# Patient Record
Sex: Male | Born: 1970 | Race: White | Hispanic: No | Marital: Married | Smoking: Never smoker
Health system: Southern US, Community
[De-identification: ages and names within clinical notes are randomized; demographics above are authoritative.]

## PROBLEM LIST (undated history)

## (undated) DIAGNOSIS — I10 Essential (primary) hypertension: Secondary | ICD-10-CM

---

## 2018-12-09 ENCOUNTER — Emergency Department (HOSPITAL_COMMUNITY)

## 2018-12-09 ENCOUNTER — Emergency Department (HOSPITAL_COMMUNITY)
Admission: EM | Admit: 2018-12-09 | Discharge: 2018-12-09 | Disposition: A | Discharge status: Home / Self Care | Attending: Emergency Medicine | Admitting: Emergency Medicine

## 2018-12-09 ENCOUNTER — Other Ambulatory Visit: Payer: Self-pay

## 2018-12-09 ENCOUNTER — Encounter (HOSPITAL_COMMUNITY): Payer: Self-pay | Admitting: Obstetrics and Gynecology

## 2018-12-09 DIAGNOSIS — W228XXA Striking against or struck by other objects, initial encounter: Secondary | ICD-10-CM | POA: Insufficient documentation

## 2018-12-09 DIAGNOSIS — I10 Essential (primary) hypertension: Secondary | ICD-10-CM | POA: Insufficient documentation

## 2018-12-09 DIAGNOSIS — S82424A Nondisplaced transverse fracture of shaft of right fibula, initial encounter for closed fracture: Secondary | ICD-10-CM | POA: Diagnosis not present

## 2018-12-09 DIAGNOSIS — Y9389 Activity, other specified: Secondary | ICD-10-CM | POA: Diagnosis not present

## 2018-12-09 DIAGNOSIS — Y9289 Other specified places as the place of occurrence of the external cause: Secondary | ICD-10-CM | POA: Diagnosis not present

## 2018-12-09 DIAGNOSIS — S8991XA Unspecified injury of right lower leg, initial encounter: Secondary | ICD-10-CM | POA: Diagnosis present

## 2018-12-09 DIAGNOSIS — Y990 Civilian activity done for income or pay: Secondary | ICD-10-CM | POA: Insufficient documentation

## 2018-12-09 HISTORY — DX: Essential (primary) hypertension: I10

## 2018-12-09 NOTE — ED Triage Notes (Signed)
Pt reports he was hurt at work on his right leg. Pt reports he cannot put weight on the leg and was hit by a heavy object. Pt has some discoloration to the area

## 2018-12-09 NOTE — ED Provider Notes (Signed)
[facility]-EMERGENCY DEPT Provider Note   CSN: [id number] Arrival date & time: 12/09/18  2047     History   Chief Complaint Chief Complaint  Patient presents with  . Leg Pain    HPI David Walters is a 48 y.o. male.  HPI  48 year old male presents with right lateral lower leg injury.  He was driving a cart at work at about 5 miles an hour and his right leg hit an object.  He has had immediate pain and swelling since.  He is unable to bear weight due to the pain.  At rest there is essentially no pain but any type of movement or trying to put weight on it makes the pain worse.  No weakness or numbness.  Past Medical History:  Diagnosis Date  . Hypertension     There are no active problems to display for this patient.   History reviewed. No pertinent surgical history.      Home Medications    Prior to Admission medications   Not on File    Family History No family history on file.  Social History Social History   Tobacco Use  . Smoking status: Never Smoker  Substance Use Topics  . Alcohol use: Not Currently  . Drug use: Not Currently     Allergies   Sulfa antibiotics   Review of Systems Review of Systems  Musculoskeletal: Positive for arthralgias and joint swelling.  Neurological: Negative for weakness and numbness.     Physical Exam Updated Vital Signs BP (!) 140/92   Pulse 93   Temp 98.3 F (36.8 C) (Oral)   Resp 16   Ht 5\' 10"  (1.778 m)   Wt 86.2 kg   SpO2 100%   BMI 27.26 kg/m   Physical Exam Vitals signs and nursing note reviewed.  Constitutional:      Appearance: He is well-developed.  HENT:     Head: Normocephalic and atraumatic.     Right Ear: External ear normal.     Left Ear: External ear normal.     Nose: Nose normal.  Eyes:     General:        Right eye: No discharge.        Left eye: No discharge.  Neck:     Musculoskeletal: Neck supple.  Cardiovascular:     Rate and Rhythm: Normal rate and  regular rhythm.     Pulses:          Dorsalis pedis pulses are 2+ on the right side.  Pulmonary:     Effort: Pulmonary effort is normal.  Abdominal:     General: There is no distension.  Musculoskeletal:     Right knee: No tenderness found.     Right ankle: He exhibits normal range of motion. No tenderness.     Right lower leg: He exhibits tenderness, bony tenderness and swelling.       Legs:     Right foot: Normal range of motion. No tenderness.     Comments: Normal strength testing, sensation, and movement in the right lower leg  Skin:    General: Skin is warm and dry.  Neurological:     Mental Status: He is alert.  Psychiatric:        Mood and Affect: Mood is not anxious.      ED Treatments / Results  Labs (all labs ordered are listed, but only abnormal results are displayed) Labs Reviewed - No data to display  EKG  None  Radiology Dg Tibia/fibula Right  Result Date: 12/09/2018 CLINICAL DATA:  Patient heard right lower leg at work and [employer]. Was hit by a heavy object. EXAM: RIGHT TIBIA AND FIBULA - 2 VIEW COMPARISON:  None. FINDINGS: There is an acute, closed, predominantly transverse nondisplaced fracture of the fibular diaphysis at the junction of the proximal and middle third. The tibia appears intact. No joint dislocation or effusions. Soft tissues are largely unremarkable. No radiopaque foreign body or soft tissue emphysema. IMPRESSION: Acute, closed, predominantly transverse nondisplaced fracture of the fibular diaphysis at the junction of the proximal and middle third. Electronically Signed   By: Tollie Eth M.D.   On: 12/09/2018 21:31    Procedures Procedures (including critical care time)  Medications Ordered in ED Medications - No data to display   Initial Impression / Assessment and Plan / ED Course  I have reviewed the triage vital signs and the nursing notes.  Pertinent labs & imaging results that were available during my care of the patient  were reviewed by me and considered in my medical decision making (see chart for details).     Patient is neurovascular intact.  No evidence of peroneal injury or compartment syndrome.  Discussed with Dr. Roda Shutters, will place in cam walker with crutches for weightbearing as tolerated.  He declines anything stronger than ibuprofen at home and does not want any medicine currently.  He appears stable for discharge to follow-up with Ortho in 1-2 weeks.  Final Clinical Impressions(s) / ED Diagnoses   Final diagnoses:  Closed nondisplaced transverse fracture of shaft of right fibula, initial encounter    ED Discharge Orders    None       Pricilla Loveless, MD 12/09/18 2336

## 2018-12-09 NOTE — Discharge Instructions (Signed)
You may bear weight as tolerated.  When you are at rest, elevate your leg and you can apply ice.  Make sure you are always wearing the boot prior to putting your foot down.  Call the orthopedist tomorrow for an appointment in 1-2 weeks.  If you develop severe or intractable pain, severe swelling, weakness or numbness in your foot, or any other new/concerning symptoms then return to the ER for evaluation.

## 2019-09-26 IMAGING — CR DG TIBIA/FIBULA 2V*R*
4 series · 4 of 4 positions shown · non-contrast
Comparison: None.

CLINICAL DATA: Patient heard right lower leg at work and canal
weightbear. Was hit by a heavy object.

EXAM:
RIGHT TIBIA AND FIBULA - 2 VIEW

[x tib-fib ap right (1 of 2)]
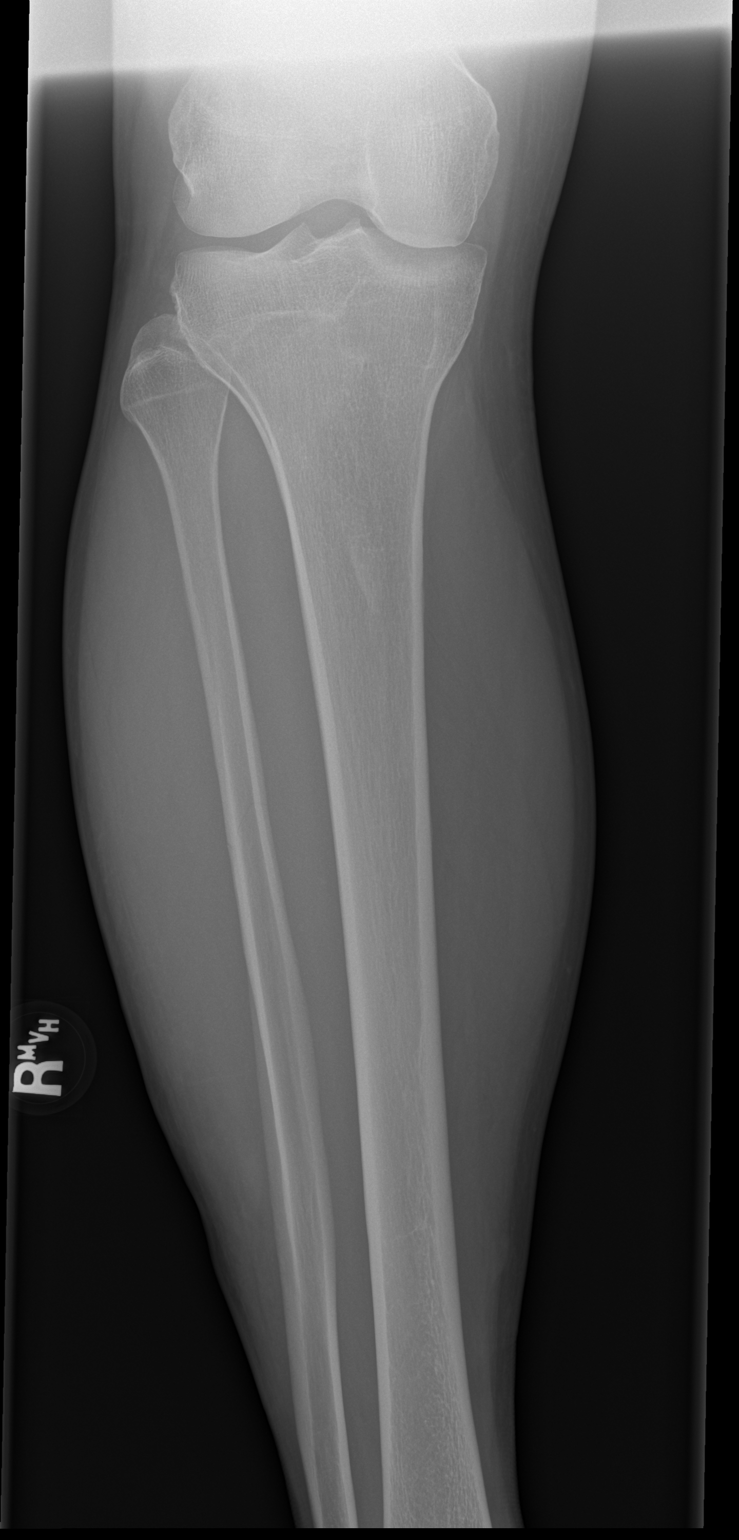

[x tib-fib ap right (2 of 2)]
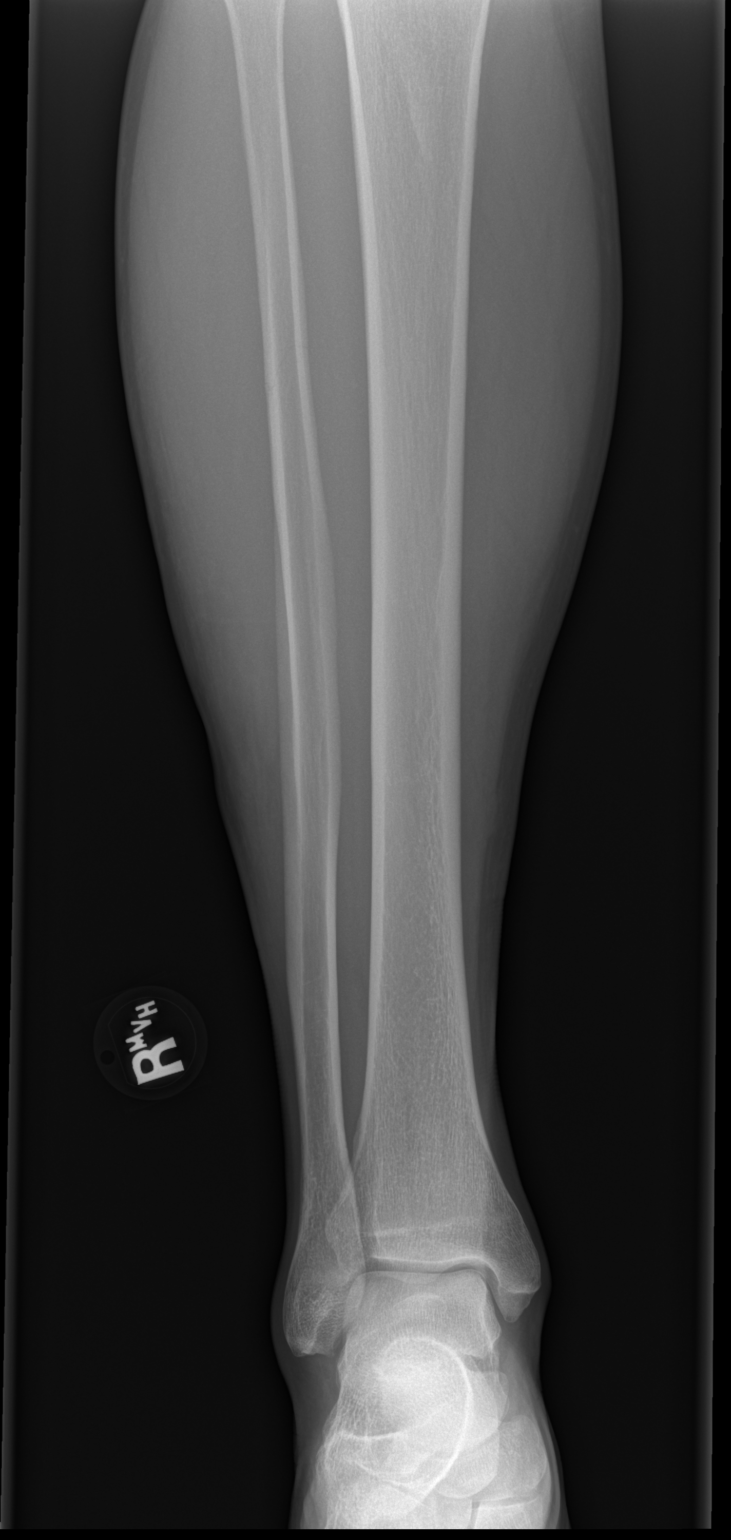

[x tib-fib lat right (1 of 2)]
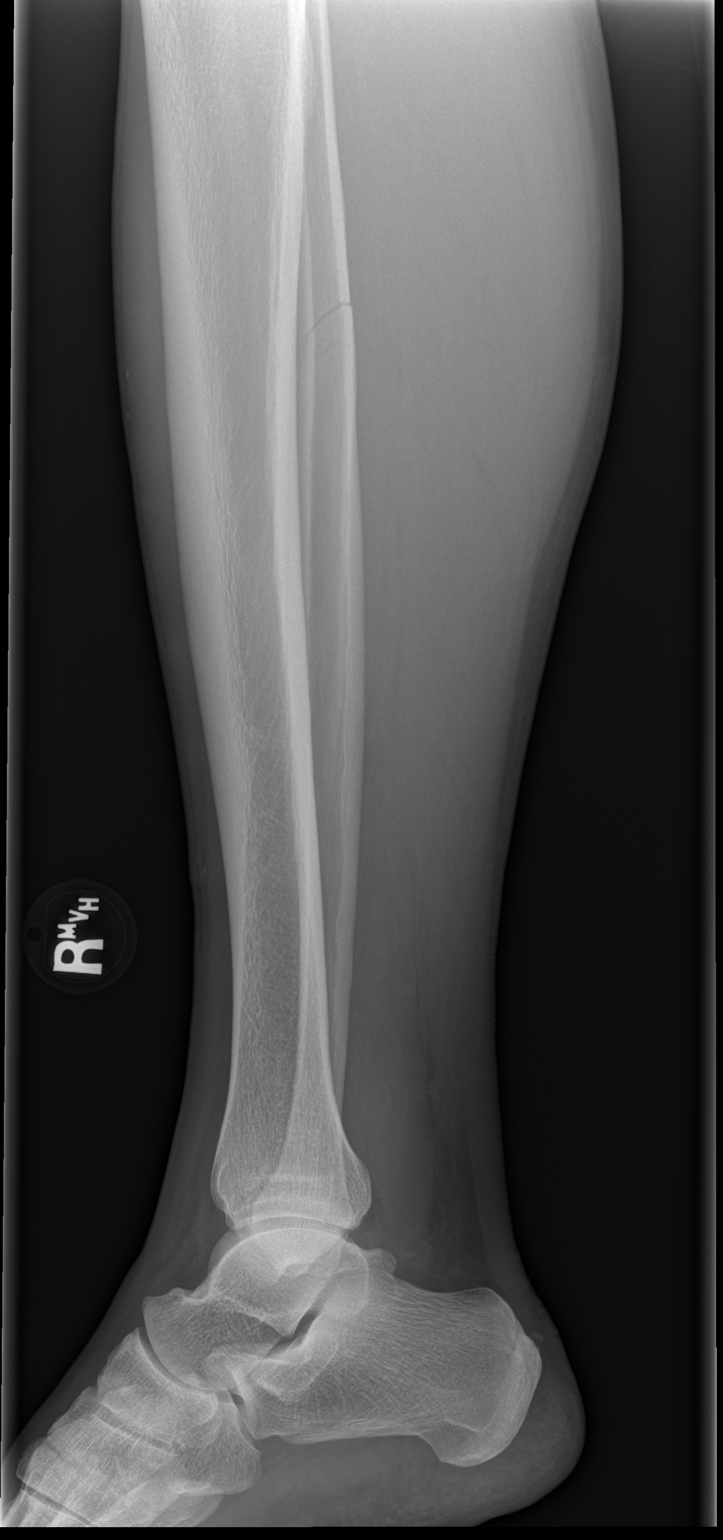

[x tib-fib lat right (2 of 2)]
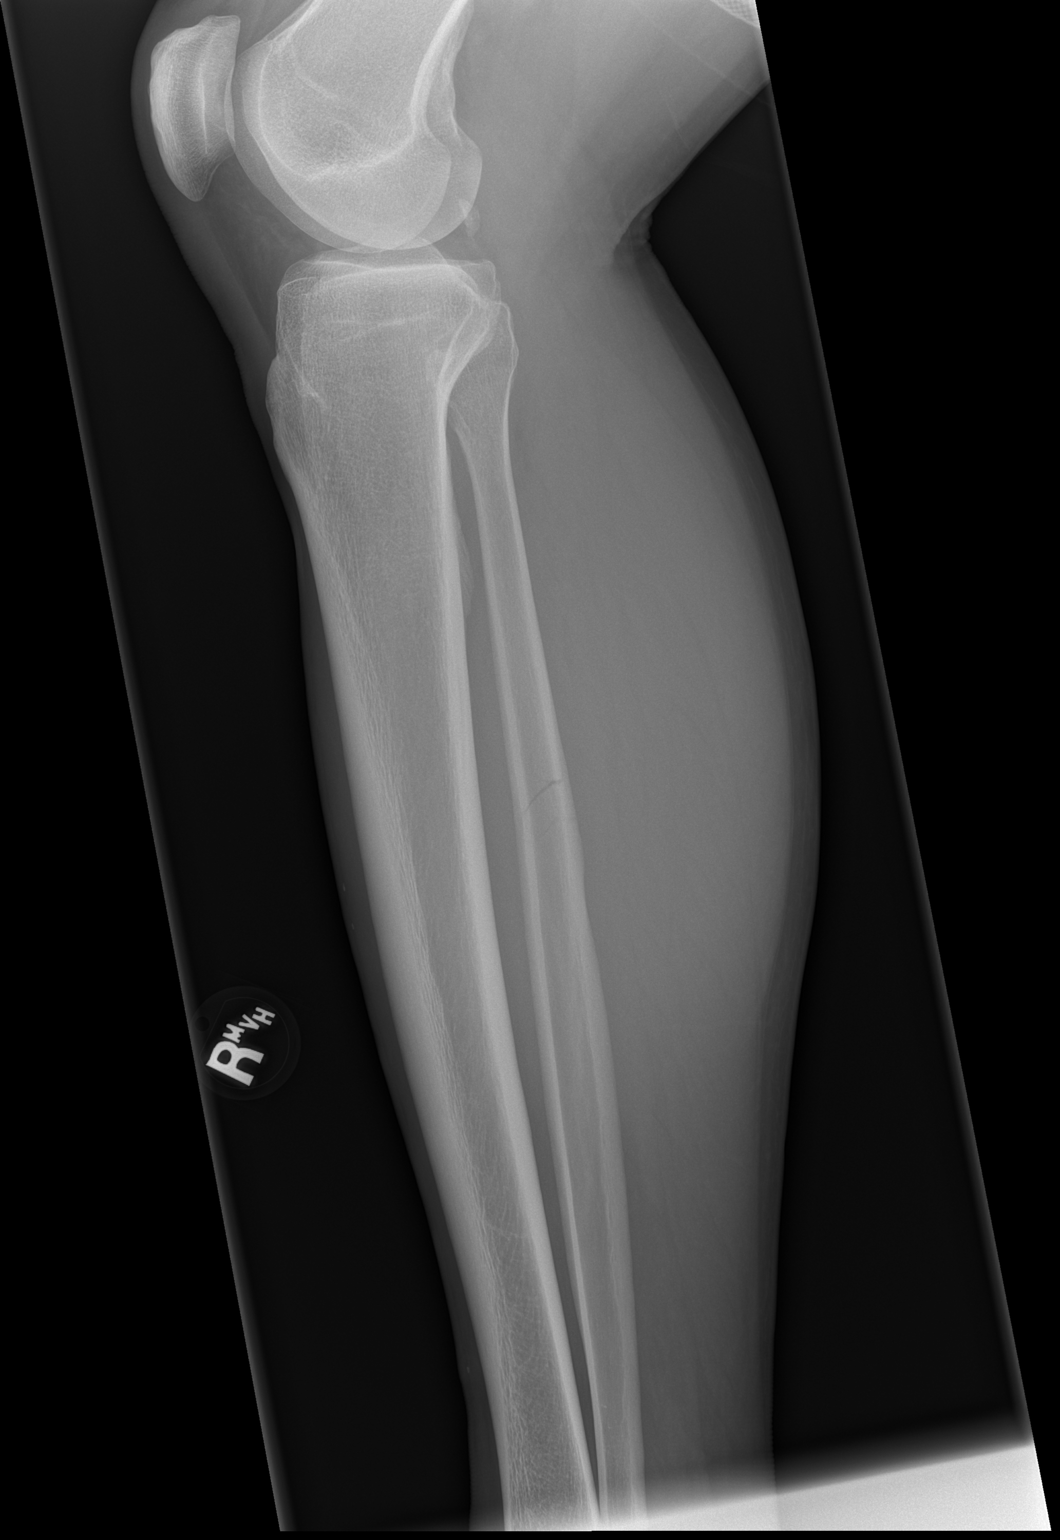

[4 of 4 positions shown; findings below may reference images not displayed]

FINDINGS: There is an acute, closed, predominantly transverse nondisplaced
fracture of the fibular diaphysis at the junction of the proximal
and middle third. The tibia appears intact. No joint dislocation or
effusions. Soft tissues are largely unremarkable. No radiopaque
foreign body or soft tissue emphysema.
IMPRESSION: Acute, closed, predominantly transverse nondisplaced fracture of the
fibular diaphysis at the junction of the proximal and middle third.

## 2022-12-22 ENCOUNTER — Ambulatory Visit (INDEPENDENT_AMBULATORY_CARE_PROVIDER_SITE_OTHER): Payer: Commercial Managed Care - PPO | Admitting: Sports Medicine

## 2022-12-22 ENCOUNTER — Encounter: Payer: Self-pay | Admitting: Sports Medicine

## 2022-12-22 ENCOUNTER — Ambulatory Visit (INDEPENDENT_AMBULATORY_CARE_PROVIDER_SITE_OTHER): Payer: Commercial Managed Care - PPO

## 2022-12-22 DIAGNOSIS — Z8669 Personal history of other diseases of the nervous system and sense organs: Secondary | ICD-10-CM | POA: Diagnosis not present

## 2022-12-22 DIAGNOSIS — M5136 Other intervertebral disc degeneration, lumbar region: Secondary | ICD-10-CM | POA: Diagnosis not present

## 2022-12-22 DIAGNOSIS — M5030 Other cervical disc degeneration, unspecified cervical region: Secondary | ICD-10-CM

## 2022-12-22 DIAGNOSIS — M2550 Pain in unspecified joint: Secondary | ICD-10-CM | POA: Diagnosis not present

## 2022-12-22 DIAGNOSIS — M51369 Other intervertebral disc degeneration, lumbar region without mention of lumbar back pain or lower extremity pain: Secondary | ICD-10-CM | POA: Insufficient documentation

## 2022-12-22 NOTE — Progress Notes (Signed)
    Procedures performed today:    None.  Independent interpretation of notes and tests performed by another provider:   None.  Brief History, Exam, Impression, and Recommendations:    Polyarthralgia Occasional widespread muscle aches and pains, he did have a partial rheumatoid workup sometime in the past in [place], it looks like his CK level was elevated over 800. No family history of rheumatoid arthritis, considering widespread arthralgias I will go and pull the trigger to rule out rheumatic disease.  DDD (degenerative disc disease), cervical This is a very pleasant 52 year old male, he has a long history of neck pain, he does have a history of bacterial meningitis in the distant past. More recently he has episodes of axial neck pain with radiation up into the occiput, nothing past the arms or shoulders. He also endorses some weakness in both legs, occasionally arms. He does also endorse photophobia, nausea and vomiting when he gets these episodes. He has been treated aggressively by his PCP for migraines including preventative measures. He also has Maxalt for use to abort migraines, this seems to help to some degree. His principal concern is how much is his neck contributing to his symptomatology. I did review a couple of his cervical spine MRIs from the different distant past, 1 approximately 20 years ago and another 1 approximately 9 years ago. He does have C5-C6 degenerative disc disease which what sounds to be some spinal cord compression without any mention of myelomalacia. I told him my role here would be to evaluate the cervical component. Adding formal physical therapy at [facility], x-rays, I like to see him back after 6 to 8 weeks and we can do an MRI if no better, if no better we would consider cervical epidural.  Lumbar degenerative disc disease Also having bilateral radicular symptoms, leg weakness, cramping. Adding lumbar spine x-rays, physical therapy. He does  desire to avoid pharmacologic intervention if possible. We will do this for 6 to 8 weeks with an MRI if not better.  I spent 45 minutes of total time managing this patient today, this includes chart review, face to face, and non-face to face time.  ____________________________________________ Gwen Her. Dianah Field, M.D., ABFM., CAQSM., AME. Primary Care and Sports Medicine [facility]  Adjunct Professor of [employer]  Risk manager

## 2022-12-22 NOTE — Assessment & Plan Note (Signed)
Also having bilateral radicular symptoms, leg weakness, cramping. Adding lumbar spine x-rays, physical therapy. He does desire to avoid pharmacologic intervention if possible. We will do this for 6 to 8 weeks with an MRI if not better.

## 2022-12-22 NOTE — Assessment & Plan Note (Signed)
Occasional widespread muscle aches and pains, he did have a partial rheumatoid workup sometime in the past in [place], it looks like his CK level was elevated over 800. No family history of rheumatoid arthritis, considering widespread arthralgias I will go and pull the trigger to rule out rheumatic disease.

## 2022-12-22 NOTE — Assessment & Plan Note (Signed)
This is a very pleasant 52 year old male, he has a long history of neck pain, he does have a history of bacterial meningitis in the distant past. More recently he has episodes of axial neck pain with radiation up into the occiput, nothing past the arms or shoulders. He also endorses some weakness in both legs, occasionally arms. He does also endorse photophobia, nausea and vomiting when he gets these episodes. He has been treated aggressively by his PCP for migraines including preventative measures. He also has Maxalt for use to abort migraines, this seems to help to some degree. His principal concern is how much is his neck contributing to his symptomatology. I did review a couple of his cervical spine MRIs from the different distant past, 1 approximately 20 years ago and another 1 approximately 9 years ago. He does have C5-C6 degenerative disc disease which what sounds to be some spinal cord compression without any mention of myelomalacia. I told him my role here would be to evaluate the cervical component. Adding formal physical therapy at [facility], x-rays, I like to see him back after 6 to 8 weeks and we can do an MRI if no better, if no better we would consider cervical epidural.

## 2022-12-24 LAB — LUPUS(12) PANEL: ds DNA Ab: <1 IU/mL

## 2022-12-24 LAB — COMPREHENSIVE METABOLIC PANEL
Albumin: 4.7 g/dL (ref 3.6–5.1)
Calcium: 9.5 mg/dL (ref 8.6–10.3)
Total Bilirubin: 0.7 mg/dL (ref 0.2–1.2)

## 2022-12-25 LAB — RHEUMATOID FACTOR (IGA, IGG, IGM)
Rheumatoid Factor (IgA): <5 U (ref <=6)
Rheumatoid Factor (IgG): <5 U (ref <=6)
Rheumatoid Factor (IgM): <5 U (ref <=6)

## 2022-12-25 LAB — CBC WITH DIFFERENTIAL/PLATELET
Absolute Monocytes: 248 cells/uL (ref 200–950)
Basophils Absolute: 11 cells/uL (ref 0–200)
Basophils Relative: 0.3 %
Eosinophils Absolute: 97 cells/uL (ref 15–500)
Eosinophils Relative: 2.7 %
HCT: 44.7 % (ref 38.5–50.0)
Hemoglobin: 14.9 g/dL (ref 13.2–17.1)
Lymphs Abs: 1573 cells/uL (ref 850–3900)
MCH: 27.1 pg (ref 27.0–33.0)
MCHC: 33.3 g/dL (ref 32.0–36.0)
MCV: 81.4 fL (ref 80.0–100.0)
MPV: 10.4 fL (ref 7.5–12.5)
Monocytes Relative: 6.9 %
Neutro Abs: 1670 cells/uL (ref 1500–7800)
Neutrophils Relative %: 46.4 %
Platelets: 199 10*3/uL (ref 140–400)
RBC: 5.49 10*6/uL (ref 4.20–5.80)
RDW: 13 % (ref 11.0–15.0)
Total Lymphocyte: 43.7 %
WBC: 3.6 10*3/uL — ABNORMAL LOW (ref 3.8–10.8)

## 2022-12-25 LAB — COMPREHENSIVE METABOLIC PANEL WITH GFR
ALT: 11 U/L (ref 9–46)
CO2: 28 mmol/L (ref 20–32)
Potassium: 4.3 mmol/L (ref 3.5–5.3)

## 2022-12-25 LAB — ANTI-NUCLEAR AB-TITER (ANA TITER): ANA Titer 1: 1:40 {titer} — ABNORMAL HIGH

## 2022-12-25 LAB — LUPUS(12) PANEL
Anti Nuclear Antibody (ANA): POSITIVE — AB
C3 Complement: 111 mg/dL (ref 82–185)
C4 Complement: 23 mg/dL (ref 15–53)
ENA SM Ab Ser-aCnc: <1 AI
Rheumatoid fact SerPl-aCnc: <14 IU/mL (ref <14)
Ribosomal P Protein Ab: <1 AI
SM/RNP: <1 AI
SSA (Ro) (ENA) Antibody, IgG: <1 AI
SSB (La) (ENA) Antibody, IgG: <1 AI
Scleroderma (Scl-70) (ENA) Antibody, IgG: <1 AI
Thyroperoxidase Ab SerPl-aCnc: <1 IU/mL (ref <9)

## 2022-12-25 LAB — CYCLIC CITRUL PEPTIDE ANTIBODY, IGG: Cyclic Citrullin Peptide Ab: <16 UNITS

## 2022-12-25 LAB — URIC ACID: Uric Acid, Serum: 4.7 mg/dL (ref 4.0–8.0)

## 2022-12-25 LAB — COMPREHENSIVE METABOLIC PANEL
AG Ratio: 2.1 (calc) (ref 1.0–2.5)
AST: 17 U/L (ref 10–35)
Alkaline phosphatase (APISO): 61 U/L (ref 35–144)
BUN: 17 mg/dL (ref 7–25)
Chloride: 104 mmol/L (ref 98–110)
Creat: 0.84 mg/dL (ref 0.70–1.30)
Globulin: 2.2 g/dL (calc) (ref 1.9–3.7)
Glucose, Bld: 88 mg/dL (ref 65–99)
Sodium: 141 mmol/L (ref 135–146)
Total Protein: 6.9 g/dL (ref 6.1–8.1)

## 2022-12-25 LAB — SEDIMENTATION RATE: Sed Rate: 2 mm/h (ref 0–20)

## 2022-12-25 LAB — CK: Total CK: 91 U/L (ref 44–196)

## 2022-12-28 ENCOUNTER — Encounter: Payer: Self-pay | Admitting: Sports Medicine

## 2023-02-09 ENCOUNTER — Encounter: Payer: Self-pay | Admitting: Sports Medicine

## 2023-02-09 ENCOUNTER — Ambulatory Visit (INDEPENDENT_AMBULATORY_CARE_PROVIDER_SITE_OTHER): Payer: Commercial Managed Care - PPO | Admitting: Sports Medicine

## 2023-02-09 VITALS — BP 100/67 | HR 98

## 2023-02-09 DIAGNOSIS — M51369 Other intervertebral disc degeneration, lumbar region without mention of lumbar back pain or lower extremity pain: Secondary | ICD-10-CM

## 2023-02-09 DIAGNOSIS — M5030 Other cervical disc degeneration, unspecified cervical region: Secondary | ICD-10-CM

## 2023-02-09 DIAGNOSIS — M5136 Other intervertebral disc degeneration, lumbar region: Secondary | ICD-10-CM

## 2023-02-09 MED ORDER — PREDNISONE 50 MG PO TABS: ORAL_TABLET | ORAL | 0 refills | Status: AC

## 2023-02-09 NOTE — Assessment & Plan Note (Signed)
Pleasant 52 year old male returns, he had bilateral radicular symptoms with leg cramping, weakness, lumbar spine x-rays show degenerative processes, we added physical therapy, we did not use any medications, he reports full relief of his back and leg symptoms.

## 2023-02-09 NOTE — Assessment & Plan Note (Signed)
Chronic neck pain, history of bacterial meningitis in the past, MRI was done 20 years ago and 9 years ago, he did have what sounded to be C5-C6 DDD with some spinal cord compression without myelomalacia. He has improved with physical therapy, however still has paresthesias from the right trapezius, periscapular down to the right first through third digits consistent with a C6 and C7 radiculopathy. Proceeding with MRI, adding 5 days of prednisone to complete the treatment course. Return to see me for MRI results.

## 2023-02-09 NOTE — Progress Notes (Signed)
    Procedures performed today:    None.  Independent interpretation of notes and tests performed by another provider:   None.  Brief History, Exam, Impression, and Recommendations:    Lumbar degenerative disc disease Pleasant 52 year old male returns, he had bilateral radicular symptoms with leg cramping, weakness, lumbar spine x-rays show degenerative processes, we added physical therapy, we did not use any medications, he reports full relief of his back and leg symptoms.  DDD (degenerative disc disease), cervical Chronic neck pain, history of bacterial meningitis in the past, MRI was done 20 years ago and 9 years ago, he did have what sounded to be C5-C6 DDD with some spinal cord compression without myelomalacia. He has improved with physical therapy, however still has paresthesias from the right trapezius, periscapular down to the right first through third digits consistent with a C6 and C7 radiculopathy. Proceeding with MRI, adding 5 days of prednisone to complete the treatment course. Return to see me for MRI results.    ____________________________________________ Gwen Her. Dianah Field, M.D., ABFM., CAQSM., AME. Primary Care and Sports Medicine [facility]  Adjunct Professor of [employer]  Risk manager

## 2023-02-16 ENCOUNTER — Ambulatory Visit (INDEPENDENT_AMBULATORY_CARE_PROVIDER_SITE_OTHER): Payer: Commercial Managed Care - PPO

## 2023-02-16 DIAGNOSIS — M5030 Other cervical disc degeneration, unspecified cervical region: Secondary | ICD-10-CM | POA: Diagnosis not present

## 2024-07-12 ENCOUNTER — Encounter: Payer: Self-pay | Admitting: Sports Medicine
